# Patient Record
Sex: Male | Born: 1980 | Race: White | Hispanic: No | Marital: Single | Smoking: Current every day smoker
Health system: Southern US, Community
[De-identification: ages and names within clinical notes are randomized; demographics above are authoritative.]

## PROBLEM LIST (undated history)

## (undated) DIAGNOSIS — S0990XA Unspecified injury of head, initial encounter: Secondary | ICD-10-CM

---

## 2020-10-23 ENCOUNTER — Other Ambulatory Visit: Payer: Self-pay

## 2020-10-23 ENCOUNTER — Emergency Department (HOSPITAL_COMMUNITY)
Admission: EM | Admit: 2020-10-23 | Discharge: 2020-10-23 | Disposition: A | Discharge status: Home / Self Care | Payer: Self-pay | Attending: Emergency Medicine | Admitting: Emergency Medicine

## 2020-10-23 ENCOUNTER — Emergency Department (HOSPITAL_COMMUNITY): Payer: Self-pay

## 2020-10-23 ENCOUNTER — Encounter (HOSPITAL_COMMUNITY): Payer: Self-pay | Admitting: *Deleted

## 2020-10-23 DIAGNOSIS — X500XXA Overexertion from strenuous movement or load, initial encounter: Secondary | ICD-10-CM | POA: Insufficient documentation

## 2020-10-23 DIAGNOSIS — Y990 Civilian activity done for income or pay: Secondary | ICD-10-CM | POA: Insufficient documentation

## 2020-10-23 DIAGNOSIS — M545 Low back pain, unspecified: Secondary | ICD-10-CM | POA: Insufficient documentation

## 2020-10-23 MED ORDER — LIDOCAINE 5 % EX PTCH
1.0000 | MEDICATED_PATCH | CUTANEOUS | Status: DC
  Administered 2020-10-23: 1 via TRANSDERMAL
  Filled 2020-10-23: qty 1

## 2020-10-23 MED ORDER — METHOCARBAMOL 500 MG PO TABS
500.0000 mg | ORAL_TABLET | Freq: Once | ORAL | Status: AC
  Administered 2020-10-23: 500 mg via ORAL
  Filled 2020-10-23: qty 1

## 2020-10-23 MED ORDER — ACETAMINOPHEN 325 MG PO TABS
650.0000 mg | ORAL_TABLET | Freq: Once | ORAL | Status: AC
  Administered 2020-10-23: 650 mg via ORAL
  Filled 2020-10-23: qty 2

## 2020-10-23 MED ORDER — METHOCARBAMOL 500 MG PO TABS: 500.0000 mg | ORAL_TABLET | Freq: Two times a day (BID) | ORAL | 0 refills | Status: DC

## 2020-10-23 MED ORDER — NAPROXEN 500 MG PO TABS: 500.0000 mg | ORAL_TABLET | Freq: Two times a day (BID) | ORAL | 0 refills | Status: AC

## 2020-10-23 NOTE — ED Triage Notes (Signed)
C/o back pain x 1 week

## 2020-10-23 NOTE — ED Provider Notes (Signed)
[facility] EMERGENCY DEPARTMENT Provider Note   CSN: [id number] Arrival date & time: 10/23/20  1502     History Chief Complaint  Patient presents with  . Back Pain    Alec Blackwell is a 40 y.o. male with no significant past medical history who presents to the ED due to persistent low back pain x5 days.  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  He rates his pain a 6/10 worse with movement.  Denies chronic low back pain.  Patient denies saddle paresthesias, bowel/bladder incontinence, lower extremity numbness/tingling, lower extremity weakness, fever/chills, and history of IV drug use.  He has been taking over-the-counter pain medication with no relief.  Patient denies associated abdominal pain. No history of cancer.   History obtained from patient and past medical records. No interpreter used during encounter.      History reviewed. No pertinent past medical history.  There are no problems to display for this patient.   History reviewed. No pertinent surgical history.     No family history on file.  Social History   Tobacco Use  . Smoking status: Never Smoker  . Smokeless tobacco: Never Used  Substance Use Topics  . Alcohol use: Not Currently  . Drug use: Not Currently    Home Medications Prior to Admission medications   Medication Sig Start Date End Date Taking? Authorizing Provider  methocarbamol (ROBAXIN) 500 MG tablet Take 1 tablet (500 mg total) by mouth 2 (two) times daily. 10/23/20  Yes Llana Deshazo C, PA-C  naproxen (NAPROSYN) 500 MG tablet Take 1 tablet (500 mg total) by mouth 2 (two) times daily. 10/23/20  Yes Mannie Stabile, PA-C    Allergies    Patient has no known allergies.  Review of Systems   Review of Systems  Constitutional: Negative for chills and fever.  Gastrointestinal: Negative for abdominal pain.  Musculoskeletal: Positive for back pain.  All other systems  reviewed and are negative.   Physical Exam Updated Vital Signs BP 110/67 (BP Location: Right Arm)   Pulse 95   Temp 98.5 F (36.9 C) (Oral)   Resp 17   SpO2 97%   Physical Exam Vitals and nursing note reviewed.  Constitutional:      General: He is not in acute distress.    Appearance: He is not ill-appearing.  HENT:     Head: Normocephalic.  Eyes:     Pupils: Pupils are equal, round, and reactive to light.  Cardiovascular:     Rate and Rhythm: Normal rate and regular rhythm.     Pulses: Normal pulses.     Heart sounds: Normal heart sounds. No murmur heard. No friction rub. No gallop.   Pulmonary:     Effort: Pulmonary effort is normal.     Breath sounds: Normal breath sounds.  Abdominal:     General: Abdomen is flat. There is no distension.     Palpations: Abdomen is soft.     Tenderness: There is no abdominal tenderness. There is no guarding or rebound.  Musculoskeletal:        General: Normal range of motion.     Cervical back: Neck supple.     Comments: No thoracic or lumbar midline tenderness. Bilateral lower extremities neurovascularly intact.   Skin:    General: Skin is warm and dry.  Neurological:     General: No focal deficit present.     Mental Status: He is alert.  Psychiatric:        Mood and Affect: Mood normal.        Behavior: Behavior normal.     ED Results / Procedures / Treatments   Labs (all labs ordered are listed, but only abnormal results are displayed) Labs Reviewed - No data to display  EKG None  Radiology DG Lumbar Spine Complete  Result Date: 10/23/2020 CLINICAL DATA:  Acute lower back pain without known injury. EXAM: LUMBAR SPINE - COMPLETE 4+ VIEW COMPARISON:  None. FINDINGS: There is no evidence of lumbar spine fracture. Alignment is normal. Intervertebral disc spaces are maintained. IMPRESSION: Negative. Electronically Signed   By: Lupita Raider M.D.   On: 10/23/2020 16:42    Procedures Procedures   Medications Ordered in  ED Medications  lidocaine (LIDODERM) 5 % 1 patch (1 patch Transdermal Patch Applied 10/23/20 1603)  methocarbamol (ROBAXIN) tablet 500 mg (500 mg Oral Given 10/23/20 1603)  acetaminophen (TYLENOL) tablet 650 mg (650 mg Oral Given 10/23/20 1603)    ED Course  I have reviewed the triage vital signs and the nursing notes.  Pertinent labs & imaging results that were available during my care of the patient were reviewed by me and considered in my medical decision making (see chart for details).    MDM Rules/Calculators/A&P                         40 year old male presents to the ED due to low back pain x5 days after lifting wood at work.  Patient denies saddle paresthesias, bowel/bladder incontinence, lower extremity numbness/tingling, lower extremity weakness, fever/chills, history of cancer, and IV drug use.  Vitals all within normal limits.  Patient in no acute distress.  Benign physical exam.  No thoracic or lumbar midline tenderness.  Bilateral lower extremities neurovascularly intact.  Low suspicion for cauda equina or central cord compression.  Patient requesting x-ray of low back. X-ray personally reviewed which is negative for any acute abnormalities. Patient treated symptomatically with Tylenol, Robaxin, and Lidoderm patches. Patient discharged with symptomatic treatment and low back exercises. Strict ED precautions discussed with patient. Patient states understanding and agrees to plan. Patient discharged home in no acute distress and stable vitals.  Final Clinical Impression(s) / ED Diagnoses Final diagnoses:  Acute bilateral low back pain without sciatica    Rx / DC Orders ED Discharge Orders         Ordered    naproxen (NAPROSYN) 500 MG tablet  2 times daily        10/23/20 1611    methocarbamol (ROBAXIN) 500 MG tablet  2 times daily        10/23/20 1611           Mannie Stabile, PA-C 10/23/20 1657    Terrilee Files, MD 10/24/20 1014

## 2020-10-23 NOTE — Discharge Instructions (Signed)
It was a pleasure taking care of you today. As discussed, your x-ray was negative for any acute abnormalities.  I am sending you home with pain medication and a muscle relaxer.  Take as needed for pain.  Muscle relaxer can cause drowsiness so do not drive or operate machinery while on the medication.  You may also purchase over-the-counter Voltaren gel and Lidoderm patches for added pain relief.  I have included low back exercises.  Perform daily.  Low back pain can take up to 6 weeks to improve.  Please follow-up with PCP if symptoms not improve over the next few weeks.  Return to the ER for new or worsening symptoms.

## 2020-12-30 ENCOUNTER — Emergency Department (HOSPITAL_COMMUNITY)
Admission: EM | Admit: 2020-12-30 | Discharge: 2020-12-30 | Disposition: A | Discharge status: Home / Self Care | Payer: Medicaid Other | Attending: Emergency Medicine | Admitting: Emergency Medicine

## 2020-12-30 ENCOUNTER — Encounter (HOSPITAL_COMMUNITY): Payer: Self-pay

## 2020-12-30 ENCOUNTER — Other Ambulatory Visit: Payer: Self-pay

## 2020-12-30 DIAGNOSIS — F17210 Nicotine dependence, cigarettes, uncomplicated: Secondary | ICD-10-CM | POA: Insufficient documentation

## 2020-12-30 DIAGNOSIS — H6121 Impacted cerumen, right ear: Secondary | ICD-10-CM | POA: Insufficient documentation

## 2020-12-30 MED ORDER — CARBAMIDE PEROXIDE 6.5 % OT SOLN
10.0000 [drp] | Freq: Once | OTIC | Status: AC
  Administered 2020-12-30: 10 [drp] via OTIC
  Filled 2020-12-30: qty 15

## 2020-12-30 NOTE — Discharge Instructions (Addendum)
Use 5 to 10 drops of the Debrox to your right ear 3 times a day as needed for wax buildup.  Avoid using Q-tips inside your ear.  You may remove wax from the outside your canal with Q-tips as needed.  Follow-up with your primary care provider for recheck if needed.

## 2020-12-30 NOTE — ED Triage Notes (Signed)
Patient states that his right ear is full of ear wax and he needs it removed. States that he tried to rinse it out in the shower and was unsuccessful.

## 2020-12-30 NOTE — ED Provider Notes (Signed)
[facility] EMERGENCY DEPARTMENT Provider Note   CSN: [id number] Arrival date & time: 12/30/20  1206     History Chief Complaint  Patient presents with   Ear Fullness    Alec Blackwell is a 40 y.o. male.   Ear Fullness Pertinent negatives include no chest pain, no abdominal pain and no headaches.      Alec Blackwell is a 40 y.o. male who presents to the Emergency Department requesting evaluation for fullness of his right ear.  He states that his symptoms have been present for 1 day.  He felt that he has earwax in his ear.  He attempted to irrigate his ear while in the shower last evening.  He has used Q-tips without relief.  He states he has difficulty hearing from his right ear as well.  Nothing makes his symptoms better or worse.  He denies any nausea vomiting, headache, dizziness fever or chills.  No facial swelling or dental pain.    History reviewed. No pertinent past medical history.  There are no problems to display for this patient.   History reviewed. No pertinent surgical history.     History reviewed. No pertinent family history.  Social History   Tobacco Use   Smoking status: Every Day    Packs/day: 0.50    Types: Cigarettes   Smokeless tobacco: Never  Substance Use Topics   Alcohol use: Not Currently   Drug use: Not Currently    Home Medications Prior to Admission medications   Medication Sig Start Date End Date Taking? Authorizing Provider  methocarbamol (ROBAXIN) 500 MG tablet Take 1 tablet (500 mg total) by mouth 2 (two) times daily. 10/23/20   Mannie Stabile, PA-C  naproxen (NAPROSYN) 500 MG tablet Take 1 tablet (500 mg total) by mouth 2 (two) times daily. 10/23/20   Mannie Stabile, PA-C    Allergies    Patient has no known allergies.  Review of Systems   Review of Systems  Constitutional:  Negative for chills, fatigue and fever.  HENT:  Positive for ear pain and hearing loss. Negative for sore throat and trouble swallowing.    Respiratory:  Negative for cough.   Cardiovascular:  Negative for chest pain.  Gastrointestinal:  Negative for abdominal pain, nausea and vomiting.  Musculoskeletal:  Negative for neck pain and neck stiffness.  Skin:  Negative for rash.  Neurological:  Negative for dizziness, weakness, numbness and headaches.  Hematological:  Does not bruise/bleed easily.   Physical Exam Updated Vital Signs BP 124/78   Pulse 69   Temp 98.3 F (36.8 C) (Oral)   Resp 18   Ht 5\' 10"  (1.778 m)   Wt 73.9 kg   SpO2 99%   BMI 23.39 kg/m   Physical Exam Vitals and nursing note reviewed.  Constitutional:      General: He is not in acute distress.    Appearance: Normal appearance. He is not ill-appearing or toxic-appearing.  HENT:     Right Ear: External ear normal. There is impacted cerumen. No mastoid tenderness.     Left Ear: Tympanic membrane and ear canal normal.     Ears:     Comments: Patient has impacted cerumen the right ear canal.  Able to visualize TM.  There is a small abrasion to the right ear canal as well.  No active bleeding. Cardiovascular:     Rate and Rhythm: Normal rate and regular rhythm.     Pulses: Normal pulses.  Pulmonary:  Effort: Pulmonary effort is normal.     Breath sounds: Normal breath sounds.  Musculoskeletal:        General: Normal range of motion.     Cervical back: Normal range of motion.  Skin:    General: Skin is warm.     Capillary Refill: Capillary refill takes less than 2 seconds.  Neurological:     General: No focal deficit present.     Mental Status: He is alert.     Motor: No weakness.    ED Results / Procedures / Treatments   Labs (all labs ordered are listed, but only abnormal results are displayed) Labs Reviewed - No data to display  EKG None  Radiology No results found.  Procedures .Ear Cerumen Removal  Date/Time: 12/30/2020 2:15 PM Performed by: Pauline Aus, PA-C Authorized by: Pauline Aus, PA-C   Consent:    Consent  obtained:  Verbal   Consent given by:  Patient   Risks, benefits, and alternatives were discussed: yes     Risks discussed:  Bleeding, infection, TM perforation and incomplete removal   Alternatives discussed:  No treatment Universal protocol:    Immediately prior to procedure, a time out was called: yes     Patient identity confirmed:  Verbally with patient and provided demographic data Procedure details:    Location:  R ear   Procedure type: curette     Procedure outcomes: cerumen removed   Post-procedure details:    Inspection:  Some cerumen remaining and no bleeding   Hearing quality:  Improved   Procedure completion:  Tolerated well, no immediate complications   Medications Ordered in ED Medications  carbamide peroxide (DEBROX) 6.5 % OTIC (EAR) solution 10 drop (10 drops Right EAR Given 12/30/20 1319)    ED Course  I have reviewed the triage vital signs and the nursing notes.  Pertinent labs & imaging results that were available during my care of the patient were reviewed by me and considered in my medical decision making (see chart for details).    MDM Rules/Calculators/A&P                           Patient here for a follow evaluation of right ear fullness and decreased hearing.  Symptoms x1 day.  No history of trauma, facial swelling or neck pain. On exam, patient has cerumen impaction of the right ear canal.  TM is not visualized due to cerumen.  Patient agreeable to plan with application of Debrox and irrigation of the ear canal.  On recheck, small amount of cerumen removed by nursing staff via irrigation.  Patient agreeable to allow removal with curette. Risks and benefits discussed and pt verbalized understanding.    Large portion of cerumen removed by me without difficulty.  Patient tolerated procedure well.  Hearing has been improved.  Discussed that a small amount of cerumen still remains.  Patient dispensed Debrox solution  to use at home with instructions on use.   Patient appears appropriate for discharge home.  Final Clinical Impression(s) / ED Diagnoses Final diagnoses:  Impacted cerumen of right ear    Rx / DC Orders ED Discharge Orders     None        Pauline Aus, PA-C 12/30/20 1434    Eber Hong, MD 01/05/21 1141

## 2021-10-14 ENCOUNTER — Emergency Department (HOSPITAL_COMMUNITY): Payer: BC Managed Care – PPO

## 2021-10-14 ENCOUNTER — Other Ambulatory Visit: Payer: Self-pay

## 2021-10-14 ENCOUNTER — Encounter (HOSPITAL_COMMUNITY): Payer: Self-pay | Admitting: *Deleted

## 2021-10-14 ENCOUNTER — Emergency Department (HOSPITAL_COMMUNITY)
Admission: EM | Admit: 2021-10-14 | Discharge: 2021-10-14 | Disposition: A | Discharge status: Home / Self Care | Payer: BC Managed Care – PPO | Attending: Emergency Medicine | Admitting: Emergency Medicine

## 2021-10-14 DIAGNOSIS — M545 Low back pain, unspecified: Secondary | ICD-10-CM | POA: Insufficient documentation

## 2021-10-14 HISTORY — DX: Unspecified injury of head, initial encounter: S09.90XA

## 2021-10-14 MED ORDER — METHOCARBAMOL 500 MG PO TABS: 500.0000 mg | ORAL_TABLET | Freq: Three times a day (TID) | ORAL | 0 refills | Status: AC

## 2021-10-14 MED ORDER — HYDROCODONE-ACETAMINOPHEN 5-325 MG PO TABS: ORAL_TABLET | ORAL | 0 refills | Status: AC

## 2021-10-14 MED ORDER — PREDNISONE 10 MG PO TABS: ORAL_TABLET | ORAL | 0 refills | Status: AC

## 2021-10-14 NOTE — ED Provider Notes (Signed)
[facility] EMERGENCY DEPARTMENT Provider Note   CSN: [id number] Arrival date & time: 10/14/21  1342     History  Chief Complaint  Patient presents with   Back Pain    Alec Blackwell is a 41 y.o. male.   Back Pain Associated symptoms: no abdominal pain, no numbness and no weakness        Alec Blackwell is a 41 y.o. male who presents to the Emergency Department complaining of mid lower back pain for several months.  Today, he has had worsening pain of his lower back that radiates to his right buttock area.  States he was having difficulty bending over this morning to tie his shoes.  He went to work this morning and his boss noticed that he was limping and favoring his left side.  His boss recommended that he come to the emergency department for further evaluation.  He has taken over-the-counter pain relievers without improvement.  He denies any numbness or or weakness of his lower extremities, abdominal pain, urine or bowel changes, fever or chills.  Certain positions makes his back pain worse.  Improved at rest.   Home Medications Prior to Admission medications   Medication Sig Start Date End Date Taking? Authorizing Provider  methocarbamol (ROBAXIN) 500 MG tablet Take 1 tablet (500 mg total) by mouth 2 (two) times daily. 10/23/20   Mannie Stabile, PA-C  naproxen (NAPROSYN) 500 MG tablet Take 1 tablet (500 mg total) by mouth 2 (two) times daily. 10/23/20   Mannie Stabile, PA-C      Allergies    Patient has no known allergies.    Review of Systems   Review of Systems  Gastrointestinal:  Negative for abdominal pain, nausea and vomiting.  Genitourinary:  Negative for difficulty urinating, flank pain and hematuria.  Musculoskeletal:  Positive for back pain.  Skin:  Negative for rash.  Neurological:  Negative for weakness and numbness.   Physical Exam Updated Vital Signs BP 92/60 (BP Location: Right Arm)   Pulse 64   Temp 98.6 F (37 C) (Oral)   Resp 17   Ht 5\' 10"   (1.778 m)   Wt 75.8 kg   SpO2 100%   BMI 23.96 kg/m  Physical Exam Vitals and nursing note reviewed.  Constitutional:      General: He is not in acute distress.    Appearance: Normal appearance.  Cardiovascular:     Rate and Rhythm: Normal rate and regular rhythm.     Pulses: Normal pulses.  Pulmonary:     Effort: Pulmonary effort is normal.  Abdominal:     Palpations: Abdomen is soft.     Tenderness: There is no abdominal tenderness.  Musculoskeletal:        General: Tenderness present.     Comments: Tenderness to palpation of the lower lumbar spine.  Hip flexors extensors intact.  Skin:    General: Skin is warm.     Capillary Refill: Capillary refill takes less than 2 seconds.  Neurological:     General: No focal deficit present.     Mental Status: He is alert.     Sensory: Sensation is intact. No sensory deficit.     Motor: Motor function is intact. No weakness.     Coordination: Coordination is intact.     Gait: Gait normal.    ED Results / Procedures / Treatments   Labs (all labs ordered are listed, but only abnormal results are displayed) Labs Reviewed - No data to display  EKG None  Radiology No results found.  Procedures Procedures    Medications Ordered in ED Medications - No data to display  ED Course/ Medical Decision Making/ A&P                           Medical Decision Making Amount and/or Complexity of Data Reviewed Radiology: ordered.  Risk Prescription drug management.   Patient here for evaluation of low back pain.  Pain is of the lower lumbar spine region.  He has positive straight leg raise bilaterally.  No focal neurodeficits, no red flags to suggest cauda equina.  No recent procedures to suggest spinal abscess.  Patient is well-appearing.  Vital signs are reassuring symptoms likely musculoskeletal.  He is agreeable to symptomatic treatment and close outpatient follow-up if needed.        Final Clinical Impression(s) / ED  Diagnoses Final diagnoses:  Acute midline low back pain without sciatica    Rx / DC Orders ED Discharge Orders     None         Pauline Aus, PA-C 10/17/21 1559    Franne Forts, DO 10/21/21 2344

## 2021-10-14 NOTE — Discharge Instructions (Signed)
Alternate ice and heat to your lower back.  Follow-up with the orthopedic provider listed in a few days if your symptoms are not improving.  Return to the emergency department for any new or worsening symptoms.

## 2021-10-14 NOTE — ED Triage Notes (Signed)
Pt c/o lower back pain for months but today he tried to tie his shoes at work and he couldn't due to the pain; pt has taken otc meds with not relief

## 2022-04-06 IMAGING — DX DG LUMBAR SPINE COMPLETE 4+V
5 series · 5 of 5 positions shown · non-contrast
Comparison: None.

CLINICAL DATA: Acute lower back pain without known injury.

EXAM:
LUMBAR SPINE - COMPLETE 4+ VIEW

[l-spine ap]
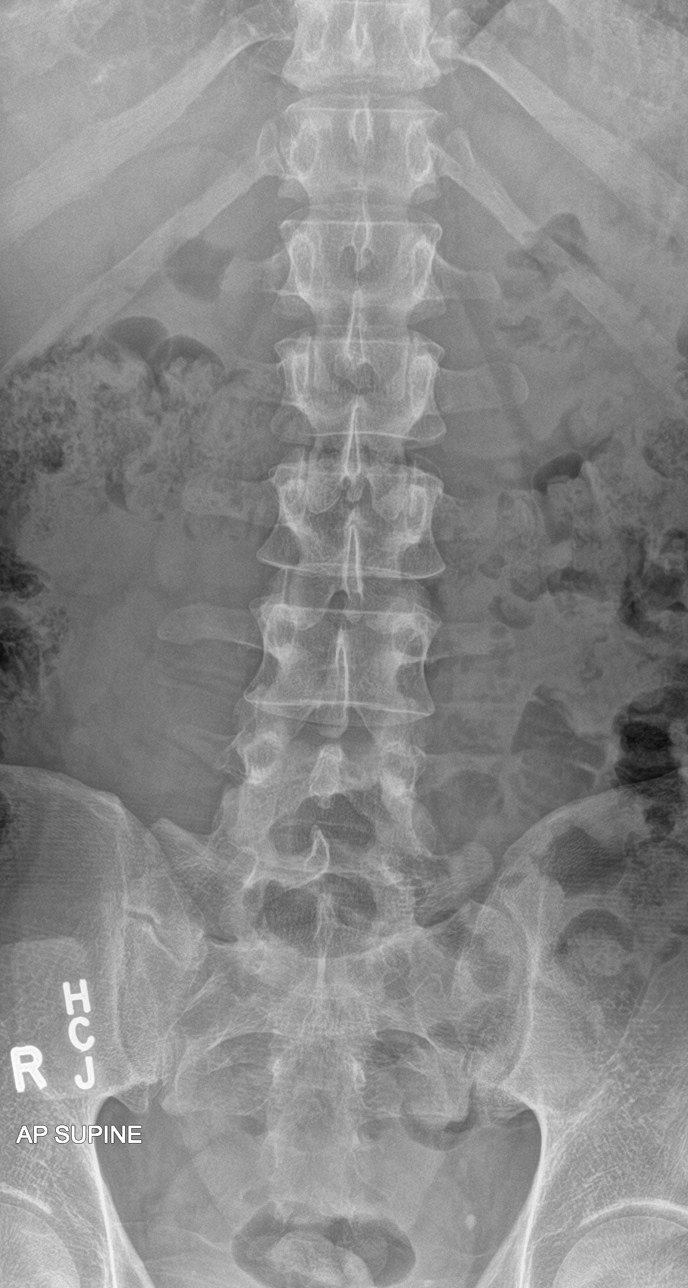

[l-spine obl (1 of 2)]
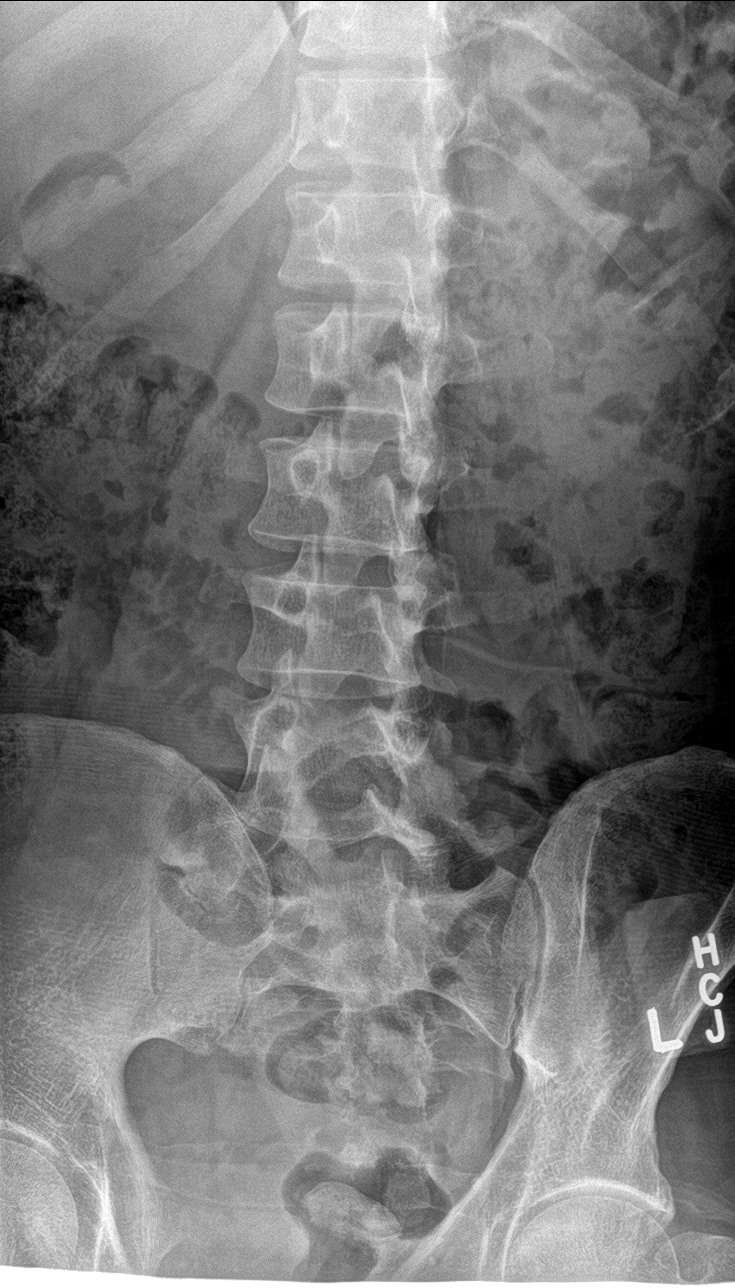

[l-spine obl (2 of 2)]
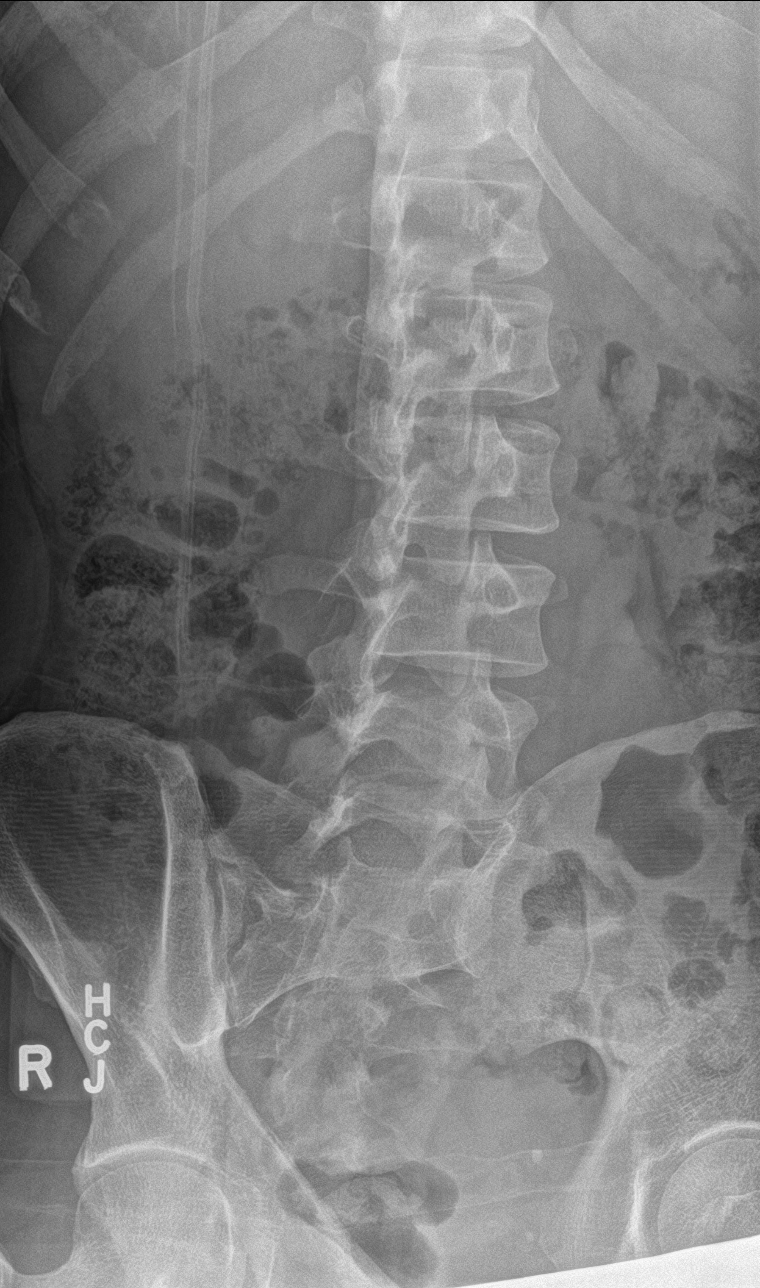

[l-spine lat]
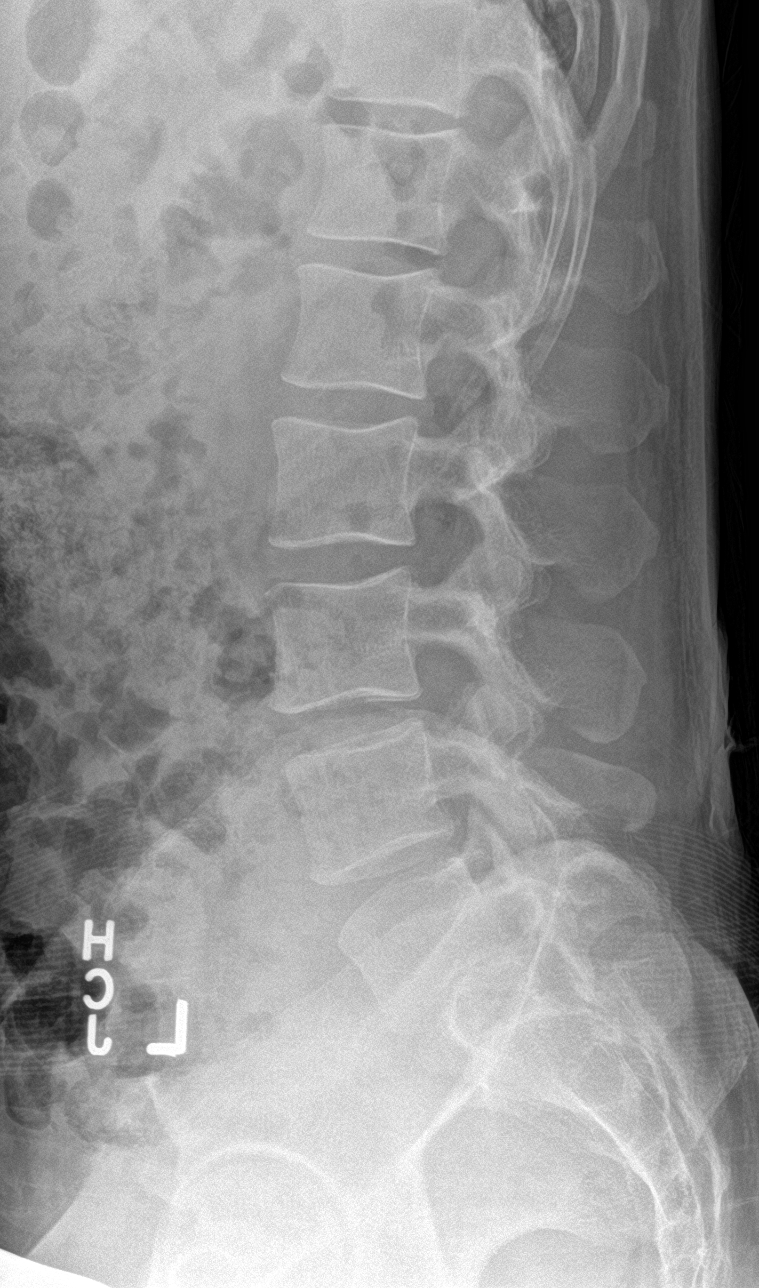

[l-spine spot]
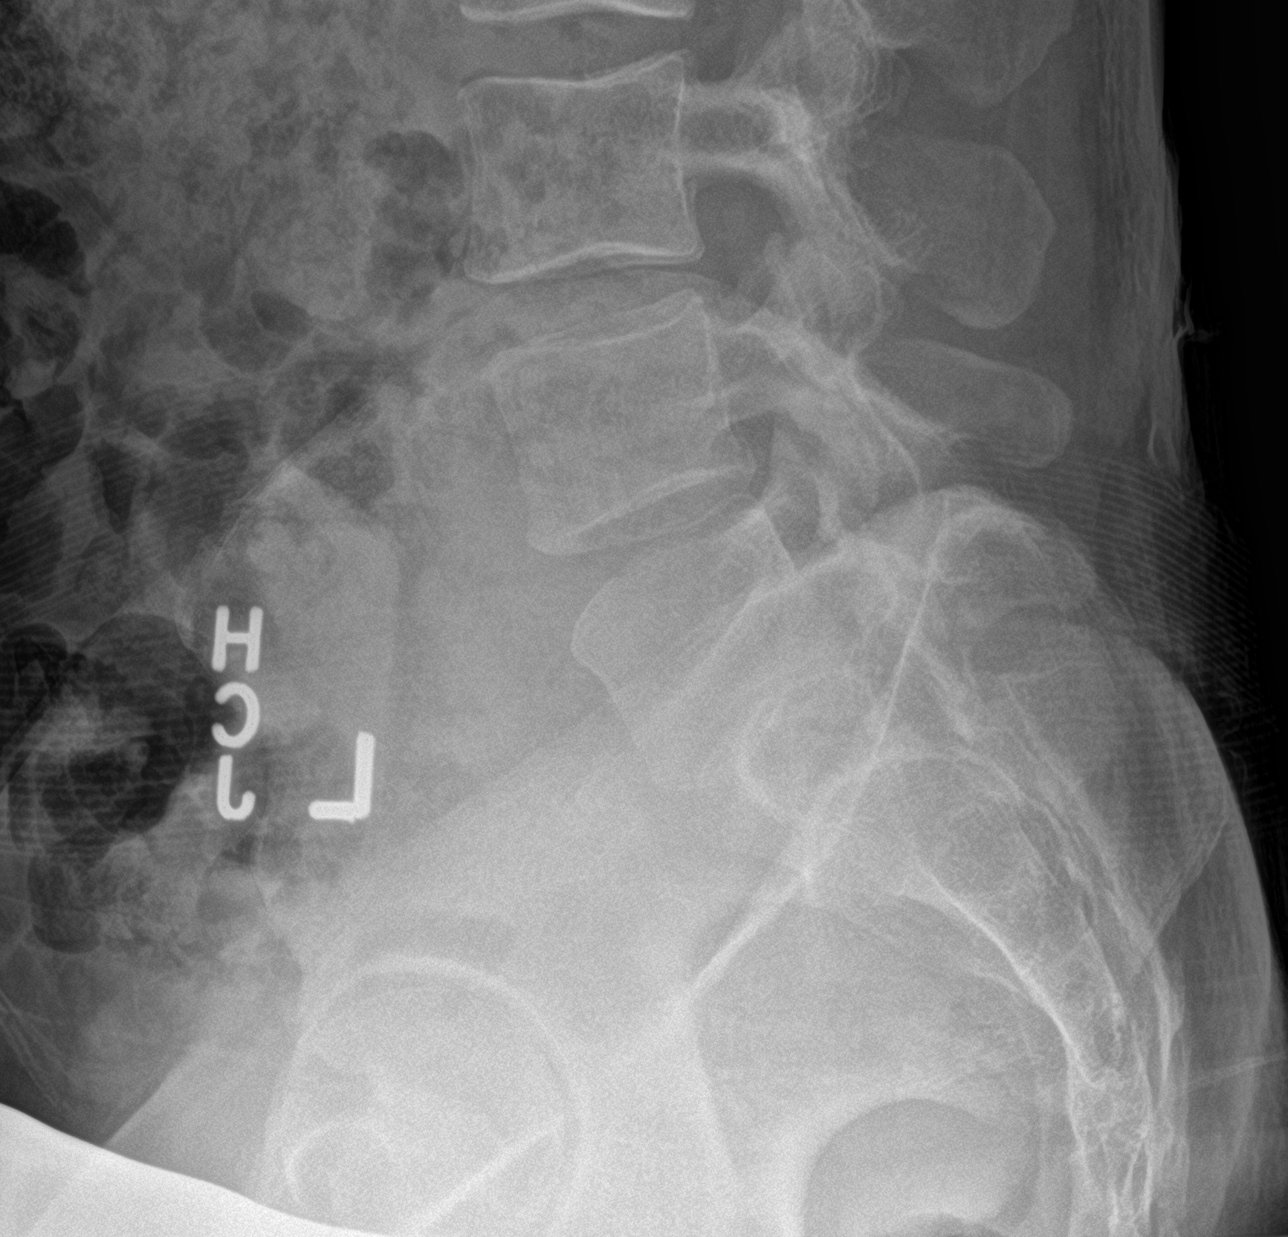

[5 of 5 positions shown; findings below may reference images not displayed]

FINDINGS: There is no evidence of lumbar spine fracture. Alignment is normal.
Intervertebral disc spaces are maintained.
IMPRESSION: Negative.
